# Patient Record
Sex: Female | Born: 1935 | Race: Black or African American | Hispanic: No | Marital: Single | Smoking: Never smoker
Health system: Southern US, Community
[De-identification: ages and names within clinical notes are randomized; demographics above are authoritative.]

## PROBLEM LIST (undated history)

## (undated) DIAGNOSIS — I10 Essential (primary) hypertension: Secondary | ICD-10-CM

## (undated) DIAGNOSIS — J45909 Unspecified asthma, uncomplicated: Secondary | ICD-10-CM

---

## 2020-10-10 ENCOUNTER — Other Ambulatory Visit: Payer: Self-pay

## 2020-10-10 ENCOUNTER — Encounter: Payer: Self-pay | Admitting: Emergency Medicine

## 2020-10-10 DIAGNOSIS — J45909 Unspecified asthma, uncomplicated: Secondary | ICD-10-CM | POA: Diagnosis not present

## 2020-10-10 DIAGNOSIS — Z79899 Other long term (current) drug therapy: Secondary | ICD-10-CM | POA: Insufficient documentation

## 2020-10-10 DIAGNOSIS — N3000 Acute cystitis without hematuria: Secondary | ICD-10-CM | POA: Diagnosis not present

## 2020-10-10 DIAGNOSIS — U071 COVID-19: Principal | ICD-10-CM | POA: Insufficient documentation

## 2020-10-10 DIAGNOSIS — I480 Paroxysmal atrial fibrillation: Secondary | ICD-10-CM | POA: Diagnosis not present

## 2020-10-10 DIAGNOSIS — I10 Essential (primary) hypertension: Secondary | ICD-10-CM | POA: Diagnosis not present

## 2020-10-10 DIAGNOSIS — K219 Gastro-esophageal reflux disease without esophagitis: Secondary | ICD-10-CM | POA: Insufficient documentation

## 2020-10-10 DIAGNOSIS — R531 Weakness: Secondary | ICD-10-CM | POA: Diagnosis present

## 2020-10-10 LAB — CBC
HCT: 40.7 % (ref 36.0–46.0)
Hemoglobin: 13.3 g/dL (ref 12.0–15.0)
MCH: 28.4 pg (ref 26.0–34.0)
MCHC: 32.7 g/dL (ref 30.0–36.0)
MCV: 86.8 fL (ref 80.0–100.0)
Platelets: 175 10*3/uL (ref 150–400)
RBC: 4.69 MIL/uL (ref 3.87–5.11)
RDW: 12.3 % (ref 11.5–15.5)
WBC: 3.5 10*3/uL — ABNORMAL LOW (ref 4.0–10.5)
nRBC: 0 % (ref 0.0–0.2)

## 2020-10-10 LAB — BASIC METABOLIC PANEL
Anion gap: 9 (ref 5–15)
BUN: 24 mg/dL — ABNORMAL HIGH (ref 8–23)
CO2: 24 mmol/L (ref 22–32)
Calcium: 9.1 mg/dL (ref 8.9–10.3)
Chloride: 103 mmol/L (ref 98–111)
Creatinine, Ser: 1.05 mg/dL — ABNORMAL HIGH (ref 0.44–1.00)
GFR, Estimated: 52 mL/min — ABNORMAL LOW (ref >60)
Glucose, Bld: 106 mg/dL — ABNORMAL HIGH (ref 70–99)
Potassium: 3.7 mmol/L (ref 3.5–5.1)
Sodium: 136 mmol/L (ref 135–145)

## 2020-10-10 LAB — TROPONIN I (HIGH SENSITIVITY)
Troponin I (High Sensitivity): 9 ng/L (ref <18)
Troponin I (High Sensitivity): 11 ng/L (ref <18)

## 2020-10-10 LAB — CBG MONITORING, ED: Glucose-Capillary: 96 mg/dL (ref 70–99)

## 2020-10-10 NOTE — ED Triage Notes (Signed)
Pt in via POV, reports new onset fatigue, weakness since Thursday.  Daughter reports she has done nothing but sleep since then, states this is far from her baseline.  Vitals WDL, NAD distress noted at this time.

## 2020-10-11 ENCOUNTER — Encounter: Payer: Self-pay | Admitting: Internal Medicine

## 2020-10-11 ENCOUNTER — Inpatient Hospital Stay: Payer: Medicare Other

## 2020-10-11 ENCOUNTER — Observation Stay
Admission: EM | Admit: 2020-10-11 | Discharge: 2020-10-13 | Disposition: A | Discharge status: Home / Self Care | Payer: Medicare Other | Attending: Internal Medicine | Admitting: Internal Medicine

## 2020-10-11 DIAGNOSIS — R531 Weakness: Secondary | ICD-10-CM

## 2020-10-11 DIAGNOSIS — R001 Bradycardia, unspecified: Secondary | ICD-10-CM | POA: Diagnosis present

## 2020-10-11 DIAGNOSIS — I10 Essential (primary) hypertension: Secondary | ICD-10-CM

## 2020-10-11 DIAGNOSIS — J45909 Unspecified asthma, uncomplicated: Secondary | ICD-10-CM

## 2020-10-11 DIAGNOSIS — U071 COVID-19: Secondary | ICD-10-CM | POA: Diagnosis not present

## 2020-10-11 DIAGNOSIS — N3000 Acute cystitis without hematuria: Secondary | ICD-10-CM

## 2020-10-11 DIAGNOSIS — I480 Paroxysmal atrial fibrillation: Secondary | ICD-10-CM

## 2020-10-11 DIAGNOSIS — K219 Gastro-esophageal reflux disease without esophagitis: Secondary | ICD-10-CM | POA: Diagnosis present

## 2020-10-11 DIAGNOSIS — R0602 Shortness of breath: Secondary | ICD-10-CM

## 2020-10-11 HISTORY — DX: Unspecified asthma, uncomplicated: J45.909

## 2020-10-11 HISTORY — DX: Essential (primary) hypertension: I10

## 2020-10-11 LAB — URINALYSIS, COMPLETE (UACMP) WITH MICROSCOPIC
Glucose, UA: NEGATIVE mg/dL
Hgb urine dipstick: NEGATIVE
Ketones, ur: 15 mg/dL — AB
Nitrite: NEGATIVE
Protein, ur: 100 mg/dL — AB
Specific Gravity, Urine: >1.03 — ABNORMAL HIGH (ref 1.005–1.030)
WBC, UA: >50 WBC/hpf (ref 0–5)
pH: 5.5 (ref 5.0–8.0)

## 2020-10-11 LAB — RESP PANEL BY RT-PCR (FLU A&B, COVID) ARPGX2
Influenza A by PCR: NEGATIVE
Influenza B by PCR: NEGATIVE
SARS Coronavirus 2 by RT PCR: POSITIVE — AB

## 2020-10-11 MED ORDER — ENOXAPARIN SODIUM 40 MG/0.4ML IJ SOSY
40.0000 mg | PREFILLED_SYRINGE | INTRAMUSCULAR | Status: DC
  Administered 2020-10-12 (×2): 40 mg via SUBCUTANEOUS
  Filled 2020-10-11 (×2): qty 0.4

## 2020-10-11 MED ORDER — ACETAMINOPHEN 325 MG PO TABS: 650.0000 mg | ORAL_TABLET | Freq: Four times a day (QID) | ORAL | Status: DC | PRN

## 2020-10-11 MED ORDER — ONDANSETRON HCL 4 MG/2ML IJ SOLN: 4.0000 mg | Freq: Four times a day (QID) | INTRAMUSCULAR | Status: DC | PRN

## 2020-10-11 MED ORDER — SODIUM CHLORIDE 0.9% FLUSH
3.0000 mL | Freq: Two times a day (BID) | INTRAVENOUS | Status: DC
  Administered 2020-10-12 – 2020-10-13 (×4): 3 mL via INTRAVENOUS

## 2020-10-11 MED ORDER — SODIUM CHLORIDE 0.9 % IV SOLN: 250.0000 mL | INTRAVENOUS | Status: DC | PRN

## 2020-10-11 MED ORDER — PANTOPRAZOLE SODIUM 40 MG PO TBEC
40.0000 mg | DELAYED_RELEASE_TABLET | Freq: Every day | ORAL | Status: DC
Start: 2020-10-11 — End: 2020-10-13
  Administered 2020-10-11 – 2020-10-13 (×3): 40 mg via ORAL
  Filled 2020-10-11 (×3): qty 1

## 2020-10-11 MED ORDER — REMDESIVIR 100 MG IV SOLR
100.0000 mg | Freq: Every day | INTRAVENOUS | Status: AC
Start: 2020-10-12 — End: 2020-10-13
  Administered 2020-10-12 – 2020-10-13 (×2): 100 mg via INTRAVENOUS
  Filled 2020-10-11: qty 20
  Filled 2020-10-11: qty 100

## 2020-10-11 MED ORDER — MOMETASONE FURO-FORMOTEROL FUM 200-5 MCG/ACT IN AERO
2.0000 | INHALATION_SPRAY | Freq: Two times a day (BID) | RESPIRATORY_TRACT | Status: DC
  Administered 2020-10-12 – 2020-10-13 (×2): 2 via RESPIRATORY_TRACT
  Filled 2020-10-11 (×2): qty 8.8

## 2020-10-11 MED ORDER — SODIUM CHLORIDE 0.9 % IV SOLN
200.0000 mg | Freq: Once | INTRAVENOUS | Status: AC
  Administered 2020-10-11: 200 mg via INTRAVENOUS
  Filled 2020-10-11: qty 200

## 2020-10-11 MED ORDER — GUAIFENESIN-DM 100-10 MG/5ML PO SYRP
10.0000 mL | ORAL_SOLUTION | ORAL | Status: DC | PRN
  Administered 2020-10-12: 10 mL via ORAL
  Filled 2020-10-11: qty 10

## 2020-10-11 MED ORDER — ONDANSETRON HCL 4 MG PO TABS: 4.0000 mg | ORAL_TABLET | Freq: Four times a day (QID) | ORAL | Status: DC | PRN

## 2020-10-11 MED ORDER — ADULT MULTIVITAMIN W/MINERALS CH
1.0000 | ORAL_TABLET | Freq: Every day | ORAL | Status: DC
  Administered 2020-10-11 – 2020-10-13 (×3): 1 via ORAL
  Filled 2020-10-11 (×3): qty 1

## 2020-10-11 MED ORDER — SODIUM CHLORIDE 0.9 % IV SOLN
1.0000 g | INTRAVENOUS | Status: DC
  Administered 2020-10-12 – 2020-10-13 (×2): 1 g via INTRAVENOUS
  Filled 2020-10-11: qty 1
  Filled 2020-10-11: qty 10

## 2020-10-11 MED ORDER — SODIUM CHLORIDE 0.9 % IV SOLN
1.0000 g | Freq: Once | INTRAVENOUS | Status: AC
  Administered 2020-10-11: 1 g via INTRAVENOUS
  Filled 2020-10-11: qty 10

## 2020-10-11 MED ORDER — ALBUTEROL SULFATE HFA 108 (90 BASE) MCG/ACT IN AERS
2.0000 | INHALATION_SPRAY | Freq: Four times a day (QID) | RESPIRATORY_TRACT | Status: DC | PRN
  Filled 2020-10-11: qty 6.7

## 2020-10-11 MED ORDER — SODIUM CHLORIDE 0.9% FLUSH: 3.0000 mL | INTRAVENOUS | Status: DC | PRN

## 2020-10-11 MED ORDER — MOMETASONE FURO-FORMOTEROL FUM 200-5 MCG/ACT IN AERO: 2.0000 | INHALATION_SPRAY | Freq: Two times a day (BID) | RESPIRATORY_TRACT | Status: DC

## 2020-10-11 MED ORDER — AMLODIPINE BESYLATE 10 MG PO TABS
10.0000 mg | ORAL_TABLET | Freq: Every day | ORAL | Status: DC
  Administered 2020-10-11 – 2020-10-13 (×3): 10 mg via ORAL
  Filled 2020-10-11: qty 1
  Filled 2020-10-11 (×2): qty 2

## 2020-10-11 MED ORDER — ASCORBIC ACID 500 MG PO TABS
500.0000 mg | ORAL_TABLET | Freq: Every day | ORAL | Status: DC
  Administered 2020-10-11 – 2020-10-13 (×3): 500 mg via ORAL
  Filled 2020-10-11 (×3): qty 1

## 2020-10-11 MED ORDER — ACETAMINOPHEN ER 650 MG PO TBCR: 650.0000 mg | EXTENDED_RELEASE_TABLET | Freq: Three times a day (TID) | ORAL | Status: DC | PRN

## 2020-10-11 MED ORDER — ZINC SULFATE 220 (50 ZN) MG PO CAPS
220.0000 mg | ORAL_CAPSULE | Freq: Every day | ORAL | Status: DC
  Administered 2020-10-11 – 2020-10-13 (×3): 220 mg via ORAL
  Filled 2020-10-11 (×3): qty 1

## 2020-10-11 NOTE — H&P (Signed)
History and Physical    Carol Cabrera KZS:[id number] DOB: Dec 12, 1934 DOA: 10/11/2020  PCP: Pcp, No   Patient coming from: Home  I have personally briefly reviewed patient's old medical records in [facility]  Chief Complaint: Generalized weakness  HPI: Carol Cabrera is a 86 y.o. female with medical history significant for hypertension and asthma who presents to the ER for evaluation of generalized weakness. Patient states that her symptoms started about 5 days ago while she was at work where she just did not feel well and felt very weak and tired.  She states that she left work and went home and has been in her bed since then. She complains of generalized weakness, malaise, body aches, cough productive of clear phlegm and chills.  Her appetite has been poor and she denies having any nausea, no vomiting, no diarrhea.  Patient has been very weak and has had difficulty with ambulating even short distances because she feels very weak and like she is going to pass out which concerned her daughter who brought her to the emergency room for further evaluation. Patient has received 2 doses of the COVID-19 vaccine but not the booster and denies having any sick contacts. She denies having any chest pain, no shortness of breath, no urinary frequency, no nocturia, no dysuria, no changes in her bowel habits, no leg swelling, no blurred vision, no focal deficits, no palpitations or diaphoresis. Labs show sodium 136, potassium 3.7, chloride 103, bicarb 24, glucose 106, BUN 4, creatinine 1.05, calcium 9.1, troponin 9 >> 11, white count 3.5, hemoglobin 13.3, hematocrit 40.7, MCV 86.8, RDW 12.3, platelet count 175 Patient's SARS coronavirus 2 point-of-care test is positive Twelve-lead EKG reviewed by me shows sinus rhythm with PACs.   ED Course: Patient is an 86 year old African-American female with a past medical history significant for hypertension, asthma and GERD who presents to the ER for evaluation of a  5-day history of generalized weakness, poor oral intake, myalgias and chills.  Her point-of-care coronavirus 2 test is positive. Patient received 2 doses of the COVID-19 vaccine but not the booster shot.  She will be admitted to the hospital for further evaluation.  Review of Systems: As per HPI otherwise all other systems reviewed and negative.    Past Medical History:  Diagnosis Date   Asthma    Hypertension     History reviewed. No pertinent surgical history.   reports that she has never smoked. She has never used smokeless tobacco. She reports that she does not currently use alcohol. She reports that she does not use drugs.  Allergies  Allergen Reactions   Aspirin Shortness Of Breath    History reviewed. No pertinent family history.  None   Prior to Admission medications   Medication Sig Start Date End Date Taking? Authorizing Provider  acetaminophen (TYLENOL) 650 MG CR tablet Take 650 mg by mouth every 8 (eight) hours as needed for pain.   Yes [provider]  diphenhydrAMINE (BENADRYL) 25 mg capsule Take 50 mg by mouth daily at 12 noon.   Yes [provider]  Multiple Vitamins-Minerals (MULTIVITAMIN WITH MINERALS) tablet Take 1 tablet by mouth daily.   Yes [provider]  amLODipine (NORVASC) 10 MG tablet Take 10 mg by mouth daily. 09/22/20   [provider]  esomeprazole (NEXIUM) 40 MG capsule Take 40 mg by mouth daily. 09/22/20   [provider]  losartan (COZAAR) 50 MG tablet Take 50 mg by mouth daily. 09/29/20   [provider]  WIXELA INHUB 250-50 MCG/ACT AEPB Inhale 1 puff into the lungs daily. 09/29/20   [provider]    Physical Exam: Vitals:   10/10/20 1950 10/10/20 2258 10/11/20 0249 10/11/20 0528  BP:  103/88 123/74 (!) 137/92  Pulse:  82 82 72  Resp:  15 18 20   Temp:  98.7 F (37.1 C)  98.7 F (37.1 C)  TempSrc:  Oral  Oral  SpO2:  94% 95% 98%  Weight: 58.5 kg     Height: 5\' 9"  (1.753 m)         Vitals:   10/10/20 1950 10/10/20 2258 10/11/20 0249 10/11/20 0528  BP:  103/88 123/74 (!) 137/92  Pulse:  82 82 72  Resp:  15 18 20   Temp:  98.7 F (37.1 C)  98.7 F (37.1 C)  TempSrc:  Oral  Oral  SpO2:  94% 95% 98%  Weight: 58.5 kg     Height: 5\' 9"  (1.753 m)         Constitutional: Alert and oriented x 3 . Not in any apparent distress.  Looks younger than stated age HEENT:      Head: Normocephalic and atraumatic.         Eyes: PERLA, EOMI, Conjunctivae are normal. Sclera is non-icteric.       Mouth/Throat: Mucous membranes are moist.       Neck: Supple with no signs of meningismus. Cardiovascular: Regular rate and rhythm. No murmurs, gallops, or rubs. 2+ symmetrical distal pulses are present . No JVD. No LE edema Respiratory: Respiratory effort normal .Lungs sounds clear bilaterally. No wheezes, crackles, or rhonchi.  Gastrointestinal: Soft, non tender, and non distended with positive bowel sounds.  Genitourinary: No CVA tenderness. Musculoskeletal: Nontender with normal range of motion in all extremities. No cyanosis, or erythema of extremities. Neurologic:  Face is symmetric. Moving all extremities. No gross focal neurologic deficits . Skin: Skin is warm, dry.  No rash or ulcers Psychiatric: Mood and affect are normal    Labs on Admission: I have personally reviewed following labs and imaging studies  CBC: Recent Labs  Lab 10/10/20 1957  WBC 3.5*  HGB 13.3  HCT 40.7  MCV 86.8  PLT 175   Basic Metabolic Panel: Recent Labs  Lab 10/10/20 1957  NA 136  K 3.7  CL 103  CO2 24  GLUCOSE 106*  BUN 24*  CREATININE 1.05*  CALCIUM 9.1   GFR: Estimated Creatinine Clearance: 36.8 mL/min (A) (by C-G formula based on SCr of 1.05 mg/dL (H)). Liver Function Tests: No results for input(s): AST, ALT, ALKPHOS, BILITOT, PROT, ALBUMIN in the last 168 hours. No results for input(s): LIPASE, AMYLASE in the last 168 hours. No results for input(s): AMMONIA in the last  168 hours. Coagulation Profile: No results for input(s): INR, PROTIME in the last 168 hours. Cardiac Enzymes: No results for input(s): CKTOTAL, CKMB, CKMBINDEX, TROPONINI in the last 168 hours. BNP (last 3 results) No results for input(s): PROBNP in the last 8760 hours. HbA1C: No results for input(s): HGBA1C in the last 72 hours. CBG: Recent Labs  Lab 10/10/20 1958  GLUCAP 96   Lipid Profile: No results for input(s): CHOL, HDL, LDLCALC, TRIG, CHOLHDL, LDLDIRECT in the last 72 hours. Thyroid Function Tests: No results for input(s): TSH, T4TOTAL, FREET4, T3FREE, THYROIDAB in the last 72 hours. Anemia Panel: No results for input(s): VITAMINB12, FOLATE, FERRITIN, TIBC, IRON, RETICCTPCT in the last 72 hours. Urine analysis:    Component Value Date/Time  COLORURINE AMBER (A) 10/11/2020 0550   APPEARANCEUR CLOUDY (A) 10/11/2020 0550   LABSPEC >1.030 (H) 10/11/2020 0550   PHURINE 5.5 10/11/2020 0550   GLUCOSEU NEGATIVE 10/11/2020 0550   HGBUR NEGATIVE 10/11/2020 0550   BILIRUBINUR MODERATE (A) 10/11/2020 0550   KETONESUR 15 (A) 10/11/2020 0550   PROTEINUR 100 (A) 10/11/2020 0550   NITRITE NEGATIVE 10/11/2020 0550   LEUKOCYTESUR SMALL (A) 10/11/2020 0550    Radiological Exams on Admission: No results found.   Assessment/Plan Principal Problem:   COVID-19 virus detected Active Problems:   Asthma   Hypertension   GERD (gastroesophageal reflux disease)   Generalized weakness     Patient is an 86 year old African-American female with a past medical history significant for asthma and hypertension who presents to the emergency room for evaluation of generalized weakness and her COVID-19 point-of-care test is positive.    COVID-19 viral infection Will place patient on remdesivir per protocol Supportive care with antitussives, vitamin and as needed bronchodilator therapy    Hypertension Continue amlodipine   GERD Continue omeprazole   History of  asthma Stable Continue as needed bronchodilator therapy and inhaled steroids    Generalized weakness We will request PT evaluation  DVT prophylaxis: Lovenox  Code Status: full code  Family Communication: Greater than 50% of time was spent discussing patient's condition and plan of care with her and her daughter at the bedside.  All questions and concerns have been addressed.  They verbalized understanding and agree with the plan. Disposition Plan: Back to previous home environment Consults called: Physical therapy Status:At the time of admission, it appears that the appropriate admission status for this patient is inpatient. This is judged to be reasonable and necessary to provide the required intensity of service to ensure the patient's safety given the presenting symptoms, physical exam findings, and initial radiographic and laboratory data in the context of their comorbid conditions. Patient requires inpatient status due to high intensity of service, high risk for further deterioration and high frequency of surveillance required.     Lucile Shutters MD [facility]     10/11/2020, 11:15 AM

## 2020-10-11 NOTE — ED Provider Notes (Signed)
[facility] Emergency Department Provider Note   ____________________________________________   Event Date/Time   First MD Initiated Contact with Patient 10/11/20 [phone]     (approximate)  I have reviewed the triage vital signs and the nursing notes.   HISTORY  Chief Complaint Generalized Weakness    HPI Carol Cabrera is a 86 y.o. female with past medical history of hypertension and asthma who presents to the ED complaining of generalized weakness.  Patient reports that she started feeling ill 5 days ago with generalized weakness, malaise, body aches, and cough.  She denies any fevers but does report chills.  She has had a decreased appetite and has not had much to eat for the past couple of days, daughter states she has spent up to 20 hours of the day in bed.  She has had difficulty walking, is normally high functioning and continues to work.  She does endorse some mild discomfort when she urinates, denies any abdominal pain, flank pain, vomiting, or diarrhea.  She has received 2 doses of the COVID-19 vaccine, denies any sick contacts.        Past Medical History:  Diagnosis Date   Asthma    Hypertension     There are no problems to display for this patient.   History reviewed. No pertinent surgical history.  Prior to Admission medications   Not on File    Allergies Aspirin  No family history on file.  Social History Social History   Tobacco Use   Smoking status: Never   Smokeless tobacco: Never  Vaping Use   Vaping Use: Never used  Substance Use Topics   Alcohol use: Not Currently   Drug use: Never    Review of Systems  Constitutional: Positive for chills, negative for fevers.  Positive for generalized weakness and malaise. Eyes: No visual changes. ENT: No sore throat. Cardiovascular: Denies chest pain. Respiratory: Denies shortness of breath. Gastrointestinal: No abdominal pain.  No nausea, no vomiting.  No diarrhea.  No  constipation. Genitourinary: Positive for dysuria. Musculoskeletal: Negative for back pain. Skin: Negative for rash. Neurological: Negative for headaches, focal weakness or numbness.  ____________________________________________   PHYSICAL EXAM:  VITAL SIGNS: ED Triage Vitals  Enc Vitals Group     BP 10/10/20 1948 122/85     Pulse Rate 10/10/20 1948 88     Resp 10/10/20 1948 19     Temp 10/10/20 1948 99.6 F (37.6 C)     Temp Source 10/10/20 1948 Oral     SpO2 10/10/20 1948 95 %     Weight 10/10/20 1950 129 lb (58.5 kg)     Height 10/10/20 1950 5\' 9"  (1.753 m)     Head Circumference --      Peak Flow --      Pain Score 10/10/20 1950 0     Pain Loc --      Pain Edu? --      Excl. in GC? --     Constitutional: Alert and oriented. Eyes: Conjunctivae are normal. Head: Atraumatic. Nose: No congestion/rhinnorhea. Mouth/Throat: Mucous membranes are moist. Neck: Normal ROM Cardiovascular: Normal rate, regular rhythm. Grossly normal heart sounds.  2+ radial pulses bilaterally. Respiratory: Normal respiratory effort.  No retractions. Lungs CTAB. Gastrointestinal: Soft and nontender. No distention. Genitourinary: deferred Musculoskeletal: No lower extremity tenderness nor edema. Neurologic:  Normal speech and language. No gross focal neurologic deficits are appreciated. Skin:  Skin is warm, dry and intact. No rash noted. Psychiatric: Mood and affect  are normal. Speech and behavior are normal.  ____________________________________________   LABS (all labs ordered are listed, but only abnormal results are displayed)  Labs Reviewed  RESP PANEL BY RT-PCR (FLU A&B, COVID) ARPGX2 - Abnormal; Notable for the following components:      Result Value   SARS Coronavirus 2 by RT PCR POSITIVE (*)    All other components within normal limits  BASIC METABOLIC PANEL - Abnormal; Notable for the following components:   Glucose, Bld 106 (*)    BUN 24 (*)    Creatinine, Ser 1.05 (*)     GFR, Estimated 52 (*)    All other components within normal limits  CBC - Abnormal; Notable for the following components:   WBC 3.5 (*)    All other components within normal limits  URINALYSIS, COMPLETE (UACMP) WITH MICROSCOPIC - Abnormal; Notable for the following components:   Color, Urine AMBER (*)    APPearance CLOUDY (*)    Specific Gravity, Urine >1.030 (*)    Bilirubin Urine MODERATE (*)    Ketones, ur 15 (*)    Protein, ur 100 (*)    Leukocytes,Ua SMALL (*)    Bacteria, UA FEW (*)    All other components within normal limits  URINE CULTURE  CBG MONITORING, ED  TROPONIN I (HIGH SENSITIVITY)  TROPONIN I (HIGH SENSITIVITY)   ____________________________________________  EKG  ED ECG REPORT I, Chesley Noon, the attending physician, personally viewed and interpreted this ECG.   Date: 10/11/2020  EKG Time: 19:49  Rate: 93  Rhythm: normal sinus rhythm, PAC's noted  Axis: Normal  Intervals:none  ST&T Change: None    PROCEDURES  Procedure(s) performed (including Critical Care):  Procedures   ____________________________________________   INITIAL IMPRESSION / ASSESSMENT AND PLAN / ED COURSE      86 year old female with past medical history of hypertension and asthma presents to the ED with 5 days of worsening generalized weakness, malaise, chills, cough, and dysuria.  Patient is overall well-appearing with reassuring vital signs, but does appear profoundly weak and is unable to walk on her own.  EKG shows no evidence of arrhythmia or ischemia, does show frequent PACs.  Chest x-ray reviewed by me and shows no infiltrate, edema, or effusion.  Labs are unremarkable but testing for COVID-19 is positive and UA is concerning for infection.  UTI and COVID infection could both be contributing to her weakness.  We will give dose of IV Rocephin and hydrate with IV fluids given her poor oral intake.  Given her profound weakness, plan to discuss with hospitalist for  admission.      ____________________________________________   FINAL CLINICAL IMPRESSION(S) / ED DIAGNOSES  Final diagnoses:  COVID-19  Acute cystitis without hematuria  Generalized weakness     ED Discharge Orders     None        Note:  This document was prepared using Dragon voice recognition software and may include unintentional dictation errors.    Chesley Noon, MD 10/11/20 [phone]

## 2020-10-11 NOTE — ED Notes (Signed)
Pt moved to hospital bed. Pt was able to independently stand up and move to chair next to bed when swapping out beds.

## 2020-10-12 DIAGNOSIS — R001 Bradycardia, unspecified: Secondary | ICD-10-CM | POA: Diagnosis present

## 2020-10-12 DIAGNOSIS — I10 Essential (primary) hypertension: Secondary | ICD-10-CM | POA: Diagnosis not present

## 2020-10-12 DIAGNOSIS — U071 COVID-19: Secondary | ICD-10-CM | POA: Diagnosis not present

## 2020-10-12 LAB — C-REACTIVE PROTEIN: CRP: 1.1 mg/dL — ABNORMAL HIGH (ref <1.0)

## 2020-10-12 LAB — COMPREHENSIVE METABOLIC PANEL
ALT: 16 U/L (ref 0–44)
AST: 24 U/L (ref 15–41)
Albumin: 3.2 g/dL — ABNORMAL LOW (ref 3.5–5.0)
Alkaline Phosphatase: 51 U/L (ref 38–126)
Anion gap: 10 (ref 5–15)
BUN: 26 mg/dL — ABNORMAL HIGH (ref 8–23)
CO2: 23 mmol/L (ref 22–32)
Calcium: 9.1 mg/dL (ref 8.9–10.3)
Chloride: 103 mmol/L (ref 98–111)
Creatinine, Ser: 0.82 mg/dL (ref 0.44–1.00)
GFR, Estimated: >60 mL/min (ref >60)
Glucose, Bld: 83 mg/dL (ref 70–99)
Potassium: 3.7 mmol/L (ref 3.5–5.1)
Sodium: 136 mmol/L (ref 135–145)
Total Bilirubin: 0.9 mg/dL (ref 0.3–1.2)
Total Protein: 7.4 g/dL (ref 6.5–8.1)

## 2020-10-12 LAB — CBC WITH DIFFERENTIAL/PLATELET
Abs Immature Granulocytes: 0.01 10*3/uL (ref 0.00–0.07)
Basophils Absolute: 0 10*3/uL (ref 0.0–0.1)
Basophils Relative: 0 %
Eosinophils Absolute: 0 10*3/uL (ref 0.0–0.5)
Eosinophils Relative: 1 %
HCT: 38.7 % (ref 36.0–46.0)
Hemoglobin: 12.8 g/dL (ref 12.0–15.0)
Immature Granulocytes: 0 %
Lymphocytes Relative: 55 %
Lymphs Abs: 1.7 10*3/uL (ref 0.7–4.0)
MCH: 28.6 pg (ref 26.0–34.0)
MCHC: 33.1 g/dL (ref 30.0–36.0)
MCV: 86.4 fL (ref 80.0–100.0)
Monocytes Absolute: 0.3 10*3/uL (ref 0.1–1.0)
Monocytes Relative: 11 %
Neutro Abs: 1 10*3/uL — ABNORMAL LOW (ref 1.7–7.7)
Neutrophils Relative %: 33 %
Platelets: 154 10*3/uL (ref 150–400)
RBC: 4.48 MIL/uL (ref 3.87–5.11)
RDW: 12.2 % (ref 11.5–15.5)
WBC: 3 10*3/uL — ABNORMAL LOW (ref 4.0–10.5)
nRBC: 0 % (ref 0.0–0.2)

## 2020-10-12 LAB — URINE CULTURE

## 2020-10-12 LAB — MAGNESIUM: Magnesium: 1.8 mg/dL (ref 1.7–2.4)

## 2020-10-12 LAB — D-DIMER, QUANTITATIVE: D-Dimer, Quant: 1.78 ug/mL-FEU — ABNORMAL HIGH (ref 0.00–0.50)

## 2020-10-12 LAB — PROCALCITONIN: Procalcitonin: <0.1 ng/mL

## 2020-10-12 NOTE — Progress Notes (Signed)
Patient HR dropped into 30's with a 4 second pause, this RN ran in the room and tapped on patient to wake her up. She woke up and stated she felt very nauseous. At this point HR came up into 50's with irregular rhythm. Attending MD notified and an EKG was obtained. EKG showing Afib with HR now in 90's. BP 162/94 currently.  MD made aware. Family is at bedside.

## 2020-10-12 NOTE — ED Notes (Signed)
Verbal report not received.

## 2020-10-12 NOTE — Care Management CC44 (Signed)
Condition Code 44 Documentation Completed  Patient Details  Name: Carol Cabrera MRN: [id number] Date of Birth: 1934/05/21   Condition Code 44 given:  Yes Patient signature on Condition Code 44 notice:  Yes Documentation of 2 MD's agreement:  Yes Code 44 added to claim:  Yes    Joseph Art, LCSWA 10/12/2020, 4:24 PM

## 2020-10-12 NOTE — Progress Notes (Signed)
PROGRESS NOTE    Carol Cabrera  JJK:[id number] DOB: 10/30/1934 DOA: 10/11/2020 PCP: Pcp, No   Chief complaint.  Generalized weakness. Brief Narrative:   Carol Cabrera is a 86 y.o. female with medical history significant for hypertension and asthma who presents to the ER for evaluation of generalized weakness. She was found to have positive COVID.  She was started on remdesivir. While on the monitor, patient had a 3 to 4-second pause with a base rhythm of atrial fibrillation.  But patient was asymptomatic.  Assessment & Plan:   Principal Problem:   COVID-19 virus detected Active Problems:   Asthma   Hypertension   GERD (gastroesophageal reflux disease)   Generalized weakness  #1.  COVID-19 pneumonia. Leukopenia secondary to COVID infection. Reviewed chest x-ray, right lower lobe infiltrates.  However, patient does not have any hypoxia, continue remdesivir.  No need for steroids. Also check procalcitonin level to rule out bacterial pneumonia. Continue Rocephin for now.  2.  Atrial fibrillation with bradycardia. Patient has no known history of atrial fibrillation, she had a 3 to 4-second pause with fibrillation. Will obtain cardiology consult.  #3.  Essential hypertension.     DVT prophylaxis: Lovenox Code Status: full Family Communication:  Disposition Plan:    Status is: Inpatient  Remains inpatient appropriate because:IV treatments appropriate due to intensity of illness or inability to take PO and Inpatient level of care appropriate due to severity of illness  Dispo: The patient is from: Home              Anticipated d/c is to: Home              Patient currently is not medically stable to d/c.   Difficult to place patient No        I/O last 3 completed shifts: In: -  Out: 900 [Urine:900] No intake/output data recorded.     Consultants:  cardiology  Procedures: None  Antimicrobials: Rocephin Subjective: Patient doing well today.  I was called by the  nurse, patient had a 3 to 4 seconds of pause with basal rate of atrial fibrillation. But the patient was asymptomatic and was sleeping. Patient denies any short of breath, she has a cough with thin mucus. No fever or chills. No dysuria hematuria.  No abdominal pain or nausea vomiting.  Objective: Vitals:   10/12/20 0300 10/12/20 0600 10/12/20 0700 10/12/20 0900  BP: (!) 154/84 (!) 158/83 (!) 146/85 (!) 143/65  Pulse: 77 73 77 70  Resp:  (!) 23 (!) 22 18  Temp: 99.1 F (37.3 C)     TempSrc: Oral     SpO2: 95% 95% 95% 95%  Weight:      Height:        Intake/Output Summary (Last 24 hours) at 10/12/2020 1331 Last data filed at 10/12/2020 0600 Gross per 24 hour  Intake --  Output 900 ml  Net -900 ml   Filed Weights   10/10/20 1950  Weight: 58.5 kg    Examination:  General exam: Appears calm and comfortable  Respiratory system: Clear to auscultation. Respiratory effort normal. Cardiovascular system: Irregular. No JVD, murmurs, rubs, gallops or clicks. No pedal edema. Gastrointestinal system: Abdomen is nondistended, soft and nontender. No organomegaly or masses felt. Normal bowel sounds heard. Central nervous system: Alert and oriented x2. No focal neurological deficits. Extremities: Symmetric 5 x 5 power. Skin: No rashes, lesions or ulcers Psychiatry: Judgement and insight appear normal. Mood & affect appropriate.     Data Reviewed:  I have personally reviewed following labs and imaging studies  CBC: Recent Labs  Lab 10/10/20 1957 10/12/20 0444  WBC 3.5* 3.0*  NEUTROABS  --  1.0*  HGB 13.3 12.8  HCT 40.7 38.7  MCV 86.8 86.4  PLT 175 154   Basic Metabolic Panel: Recent Labs  Lab 10/10/20 1957 10/12/20 0444  NA 136 136  K 3.7 3.7  CL 103 103  CO2 24 23  GLUCOSE 106* 83  BUN 24* 26*  CREATININE 1.05* 0.82  CALCIUM 9.1 9.1  MG  --  1.8   GFR: Estimated Creatinine Clearance: 47.2 mL/min (by C-G formula based on SCr of 0.82 mg/dL). Liver Function  Tests: Recent Labs  Lab 10/12/20 0444  AST 24  ALT 16  ALKPHOS 51  BILITOT 0.9  PROT 7.4  ALBUMIN 3.2*   No results for input(s): LIPASE, AMYLASE in the last 168 hours. No results for input(s): AMMONIA in the last 168 hours. Coagulation Profile: No results for input(s): INR, PROTIME in the last 168 hours. Cardiac Enzymes: No results for input(s): CKTOTAL, CKMB, CKMBINDEX, TROPONINI in the last 168 hours. BNP (last 3 results) No results for input(s): PROBNP in the last 8760 hours. HbA1C: No results for input(s): HGBA1C in the last 72 hours. CBG: Recent Labs  Lab 10/10/20 1958  GLUCAP 96   Lipid Profile: No results for input(s): CHOL, HDL, LDLCALC, TRIG, CHOLHDL, LDLDIRECT in the last 72 hours. Thyroid Function Tests: No results for input(s): TSH, T4TOTAL, FREET4, T3FREE, THYROIDAB in the last 72 hours. Anemia Panel: No results for input(s): VITAMINB12, FOLATE, FERRITIN, TIBC, IRON, RETICCTPCT in the last 72 hours. Sepsis Labs: No results for input(s): PROCALCITON, LATICACIDVEN in the last 168 hours.  Recent Results (from the past 240 hour(s))  Resp Panel by RT-PCR (Flu A&B, Covid) Urine, Clean Catch     Status: Abnormal   Collection Time: 10/11/20  5:30 AM   Specimen: Urine, Clean Catch; Nasopharyngeal(NP) swabs in vial transport medium  Result Value Ref Range Status   SARS Coronavirus 2 by RT PCR POSITIVE (A) NEGATIVE Final    Comment: RESULT CALLED TO, READ BACK BY AND VERIFIED WITH: Tonia Ghent, RN [phone] 10/11/20 GM (NOTE) SARS-CoV-2 target nucleic acids are DETECTED.  The SARS-CoV-2 RNA is generally detectable in upper respiratory specimens during the acute phase of infection. Positive results are indicative of the presence of the identified virus, but do not rule out bacterial infection or co-infection with other pathogens not detected by the test. Clinical correlation with patient history and other diagnostic information is necessary to determine  patient infection status. The expected result is Negative.  Fact Sheet for Patients: BloggerCourse.com  Fact Sheet for Healthcare Providers: SeriousBroker.it  This test is not yet approved or cleared by the [place] FDA and  has been authorized for detection and/or diagnosis of SARS-CoV-2 by FDA under an Emergency Use Authorization (EUA).  This EUA will remain in effect (meaning this test can be use d) for the duration of  the COVID-19 declaration under Section 564(b)(1) of the Act, 21 U.S.C. section 360bbb-3(b)(1), unless the authorization is terminated or revoked sooner.     Influenza A by PCR NEGATIVE NEGATIVE Final   Influenza B by PCR NEGATIVE NEGATIVE Final    Comment: (NOTE) The Xpert Xpress SARS-CoV-2/FLU/RSV plus assay is intended as an aid in the diagnosis of influenza from Nasopharyngeal swab specimens and should not be used as a sole basis for treatment. Nasal washings and aspirates are unacceptable for Xpert Xpress  SARS-CoV-2/FLU/RSV testing.  Fact Sheet for Patients: BloggerCourse.com  Fact Sheet for Healthcare Providers: SeriousBroker.it  This test is not yet approved or cleared by the [place] FDA and has been authorized for detection and/or diagnosis of SARS-CoV-2 by FDA under an Emergency Use Authorization (EUA). This EUA will remain in effect (meaning this test can be used) for the duration of the COVID-19 declaration under Section 564(b)(1) of the Act, 21 U.S.C. section 360bbb-3(b)(1), unless the authorization is terminated or revoked.  Performed at [facility], [address], [place], [place] [address]   Urine Culture     Status: Abnormal   Collection Time: 10/11/20  5:56 AM   Specimen: Urine, Random  Result Value Ref Range Status   Specimen Description   Final    URINE, RANDOM Performed at [facility], [address], [place], [place] [address]    Special Requests   Final    NONE Performed at [facility], [address] [place], [place] [address]    Culture MULTIPLE SPECIES PRESENT, SUGGEST RECOLLECTION (A)  Final   Report Status 10/12/2020 FINAL  Final         Radiology Studies: [facility] Chest Port 1 View  Result Date: 10/11/2020 CLINICAL DATA:  Shortness of breath and weakness. EXAM: PORTABLE CHEST 1 VIEW COMPARISON:  Chest x-ray report dated September 30, 2001. FINDINGS: The heart size and mediastinal contours are within normal limits. Normal pulmonary vascularity. Mild patchy density in the medial right lung base. No pleural effusion or pneumothorax. No acute osseous abnormality. Unchanged chronic deformity of the left ninth rib. IMPRESSION: 1. Right basilar atelectasis versus pneumonia. Electronically Signed   By: Obie Dredge M.D.   On: 10/11/2020 12:21        Scheduled Meds:  amLODipine  10 mg Oral Daily   vitamin C  500 mg Oral Daily   enoxaparin (LOVENOX) injection  40 mg Subcutaneous Q24H   mometasone-formoterol  2 puff Inhalation BID   multivitamin with minerals  1 tablet Oral Daily   pantoprazole  40 mg Oral Daily   sodium chloride flush  3 mL Intravenous Q12H   zinc sulfate  220 mg Oral Daily   Continuous Infusions:  sodium chloride     cefTRIAXone (ROCEPHIN)  IV Stopped (10/12/20 1037)   remdesivir 100 mg in NS 100 mL Stopped (10/12/20 1148)     LOS: 1 day    Time spent: 32 minutes    Marrion Coy, MD [facility]   To contact the attending provider between 7A-7P or the covering provider during after hours 7P-7A, please log into the web site [email or web address] and access using universal [place] password for that web site. If you do not have the password, please call the hospital operator.  10/12/2020, 1:31 PM

## 2020-10-12 NOTE — Care Management Obs Status (Signed)
MEDICARE OBSERVATION STATUS NOTIFICATION   Patient Details  Name: Carol Cabrera MRN: [id number] Date of Birth: 18-Dec-1934   Medicare Observation Status Notification Given:       Carol Cabrera 10/12/2020, 4:24 PM

## 2020-10-12 NOTE — Progress Notes (Signed)
Patient had another episode of HR dropping into 30's, this RN to check on patient. HR came up to 120's-130's AFib, is now currently, 90's-100. MD made aware.

## 2020-10-13 ENCOUNTER — Telehealth: Payer: Self-pay | Admitting: Cardiovascular Disease

## 2020-10-13 ENCOUNTER — Observation Stay (HOSPITAL_BASED_OUTPATIENT_CLINIC_OR_DEPARTMENT_OTHER)
Admit: 2020-10-13 | Discharge: 2020-10-13 | Disposition: A | Discharge status: Home / Self Care | Payer: Medicare Other | Attending: Cardiology | Admitting: Cardiology

## 2020-10-13 ENCOUNTER — Observation Stay: Payer: Medicare Other

## 2020-10-13 ENCOUNTER — Observation Stay (INDEPENDENT_AMBULATORY_CARE_PROVIDER_SITE_OTHER): Payer: Medicare Other | Admitting: Medical

## 2020-10-13 DIAGNOSIS — U071 COVID-19: Secondary | ICD-10-CM | POA: Diagnosis not present

## 2020-10-13 DIAGNOSIS — I4891 Unspecified atrial fibrillation: Secondary | ICD-10-CM | POA: Diagnosis not present

## 2020-10-13 DIAGNOSIS — R531 Weakness: Secondary | ICD-10-CM | POA: Diagnosis not present

## 2020-10-13 DIAGNOSIS — R001 Bradycardia, unspecified: Secondary | ICD-10-CM | POA: Diagnosis not present

## 2020-10-13 DIAGNOSIS — I455 Other specified heart block: Secondary | ICD-10-CM

## 2020-10-13 DIAGNOSIS — I480 Paroxysmal atrial fibrillation: Secondary | ICD-10-CM | POA: Diagnosis not present

## 2020-10-13 LAB — CBC WITH DIFFERENTIAL/PLATELET
Abs Immature Granulocytes: 0.03 10*3/uL (ref 0.00–0.07)
Basophils Absolute: 0 10*3/uL (ref 0.0–0.1)
Basophils Relative: 0 %
Eosinophils Absolute: 0 10*3/uL (ref 0.0–0.5)
Eosinophils Relative: 0 %
HCT: 37.8 % (ref 36.0–46.0)
Hemoglobin: 12.7 g/dL (ref 12.0–15.0)
Immature Granulocytes: 1 %
Lymphocytes Relative: 49 %
Lymphs Abs: 1.8 10*3/uL (ref 0.7–4.0)
MCH: 29.4 pg (ref 26.0–34.0)
MCHC: 33.6 g/dL (ref 30.0–36.0)
MCV: 87.5 fL (ref 80.0–100.0)
Monocytes Absolute: 0.4 10*3/uL (ref 0.1–1.0)
Monocytes Relative: 11 %
Neutro Abs: 1.5 10*3/uL — ABNORMAL LOW (ref 1.7–7.7)
Neutrophils Relative %: 39 %
Platelets: 172 10*3/uL (ref 150–400)
RBC: 4.32 MIL/uL (ref 3.87–5.11)
RDW: 12.1 % (ref 11.5–15.5)
WBC: 3.7 10*3/uL — ABNORMAL LOW (ref 4.0–10.5)
nRBC: 0 % (ref 0.0–0.2)

## 2020-10-13 LAB — COMPREHENSIVE METABOLIC PANEL
ALT: 14 U/L (ref 0–44)
AST: 23 U/L (ref 15–41)
Albumin: 3.3 g/dL — ABNORMAL LOW (ref 3.5–5.0)
Alkaline Phosphatase: 50 U/L (ref 38–126)
Anion gap: 10 (ref 5–15)
BUN: 30 mg/dL — ABNORMAL HIGH (ref 8–23)
CO2: 22 mmol/L (ref 22–32)
Calcium: 9.2 mg/dL (ref 8.9–10.3)
Chloride: 102 mmol/L (ref 98–111)
Creatinine, Ser: 0.85 mg/dL (ref 0.44–1.00)
GFR, Estimated: >60 mL/min (ref >60)
Glucose, Bld: 104 mg/dL — ABNORMAL HIGH (ref 70–99)
Potassium: 3.4 mmol/L — ABNORMAL LOW (ref 3.5–5.1)
Sodium: 134 mmol/L — ABNORMAL LOW (ref 135–145)
Total Bilirubin: 0.8 mg/dL (ref 0.3–1.2)
Total Protein: 7.2 g/dL (ref 6.5–8.1)

## 2020-10-13 LAB — ECHOCARDIOGRAM COMPLETE
AR max vel: 2.07 cm2
AV Area VTI: 1.92 cm2
AV Area mean vel: 1.8 cm2
AV Mean grad: 3 mmHg
AV Peak grad: 5.1 mmHg
Ao pk vel: 1.13 m/s
Area-P 1/2: 2.85 cm2
S' Lateral: 2.51 cm

## 2020-10-13 LAB — D-DIMER, QUANTITATIVE: D-Dimer, Quant: 1.23 ug/mL-FEU — ABNORMAL HIGH (ref 0.00–0.50)

## 2020-10-13 LAB — C-REACTIVE PROTEIN: CRP: 0.9 mg/dL (ref <1.0)

## 2020-10-13 LAB — MAGNESIUM: Magnesium: 1.8 mg/dL (ref 1.7–2.4)

## 2020-10-13 MED ORDER — METOPROLOL SUCCINATE ER 25 MG PO TB24: 25.0000 mg | ORAL_TABLET | Freq: Every day | ORAL | Status: DC

## 2020-10-13 MED ORDER — POTASSIUM CHLORIDE 20 MEQ PO PACK
40.0000 meq | PACK | Freq: Once | ORAL | Status: AC
  Administered 2020-10-13: 40 meq via ORAL
  Filled 2020-10-13: qty 2

## 2020-10-13 MED ORDER — AMLODIPINE BESYLATE 10 MG PO TABS: 10.0000 mg | ORAL_TABLET | Freq: Every day | ORAL | Status: DC

## 2020-10-13 MED ORDER — AMLODIPINE BESYLATE 5 MG PO TABS: 5.0000 mg | ORAL_TABLET | Freq: Every day | ORAL | Status: DC

## 2020-10-13 MED ORDER — AMLODIPINE BESYLATE 5 MG PO TABS: 5.0000 mg | ORAL_TABLET | Freq: Every day | ORAL | 0 refills | Status: AC

## 2020-10-13 NOTE — Telephone Encounter (Signed)
Order placed for 14 days Zio XT to be mailed to the patient. Message fwd to scheduling to call the patient to schedule a 6-7 week f/u

## 2020-10-13 NOTE — Progress Notes (Signed)
Remdesivir - Pharmacy Brief Note   O:  CXR: "Right basilar atelectasis versus pneumonia." SpO2: 94-98% on RA   A/P:  Remdesivir 200 mg IVPB once followed by 100 mg IVPB daily x 4 days.    Doloris Hall, PharmD Pharmacy Resident  10/13/2020 7:18 AM

## 2020-10-13 NOTE — Discharge Summary (Signed)
Physician Discharge Summary  Patient ID: Takirah Cabrera MRN: [id number] DOB/AGE: Jan 27, 1934 86 y.o.  Admit date: 10/11/2020 Discharge date: 10/13/2020  Admission Diagnoses:  Discharge Diagnoses:  Principal Problem:   COVID-19 virus detected Active Problems:   Asthma   Hypertension   GERD (gastroesophageal reflux disease)   Generalized weakness   Bradycardia   Discharged Condition: good  Hospital Course:  Carol Cabrera is a 86 y.o. female with medical history significant for hypertension and asthma who presents to the ER for evaluation of generalized weakness. She was found to have positive COVID.  She was started on remdesivir. While on the monitor, patient had a 3 to 4-second pause with a base rhythm of atrial fibrillation.  But patient was asymptomatic.  #1.  COVID-19 pneumonia. Leukopenia secondary to COVID infection. Reviewed chest x-ray, right lower lobe infiltrates.  However, patient does not have any hypoxia, treated with remdesivir.  No need for steroids. Procalcitonin level was also less than 0.1, no need for antibiotics.  Urine culture has multiple Artelia, no evidence of UTI.   2.  paroxysmal atrial fibrillation with bradycardia. Patient has no known history of atrial fibrillation, she had  2 episodes of pause with each episode lasted 3 to 4-second  with basal rhythm of fibrillation. Patiently evaluated by Dr. Mariah Milling from cardiology, she has since converted to sinus, no additional pauses or bradycardia.  Discussed with Dr. Mariah Milling, ZIO monitor will be set up.  Patient is medically stable from cardiology standpoint to discharge.  I have decreased patient Norvasc, patient is not on any beta-blocker. Patient be followed by Dr. Mariah Milling in 2 weeks.    #3.  Essential hypertension.  Consults: cardiology  Significant Diagnostic Studies:   PORTABLE CHEST 1 VIEW   COMPARISON:  Chest x-ray report dated September 30, 2001.   FINDINGS: The heart size and mediastinal contours are  within normal limits. Normal pulmonary vascularity. Mild patchy density in the medial right lung base. No pleural effusion or pneumothorax. No acute osseous abnormality. Unchanged chronic deformity of the left ninth rib.   IMPRESSION: 1. Right basilar atelectasis versus pneumonia.     Electronically Signed   By: Obie Dredge M.D.   On: 10/11/2020 12:21    Treatments: Remdesivir  Discharge Exam: Blood pressure 119/70, pulse 69, temperature 98.2 F (36.8 C), resp. rate 18, height 5\' 9"  (1.753 m), weight 58.5 kg, SpO2 96 %. General appearance: alert and cooperative Resp: clear to auscultation bilaterally Cardio: regular rate and rhythm, S1, S2 normal, no murmur, click, rub or gallop GI: soft, non-tender; bowel sounds normal; no masses,  no organomegaly Extremities: extremities normal, atraumatic, no cyanosis or edema  Disposition: Discharge disposition: 01-Home or Self Care       Discharge Instructions     Diet - low sodium heart healthy   Complete by: As directed    Increase activity slowly   Complete by: As directed       Allergies as of 10/13/2020       Reactions   Aspirin Shortness Of Breath        Medication List     TAKE these medications    acetaminophen 650 MG CR tablet Commonly known as: TYLENOL Take 650 mg by mouth every 8 (eight) hours as needed for pain.   amLODipine 5 MG tablet Commonly known as: NORVASC Take 1 tablet (5 mg total) by mouth daily. What changed:  medication strength how much to take   diphenhydrAMINE 25 mg capsule Commonly known as: BENADRYL Take  50 mg by mouth daily at 12 noon.   esomeprazole 40 MG capsule Commonly known as: NEXIUM Take 40 mg by mouth daily.   losartan 50 MG tablet Commonly known as: COZAAR Take 50 mg by mouth daily.   multivitamin with minerals tablet Take 1 tablet by mouth daily.   Wixela Inhub 250-50 MCG/ACT Aepb Generic drug: fluticasone-salmeterol Inhale 1 puff into the lungs daily.         Follow-up Information     Antonieta Iba, MD Follow up in 2 week(s).   Specialty: Cardiology Contact information: [address] [place] [address] [phone]                 Signed: Marrion Coy 10/13/2020, 1:56 PM

## 2020-10-13 NOTE — Progress Notes (Signed)
*  PRELIMINARY RESULTS* Echocardiogram 2D Echocardiogram has been performed.  Cristela Blue 10/13/2020, 11:38 AM

## 2020-10-13 NOTE — Evaluation (Signed)
Physical Therapy Evaluation Patient Details Name: Carol Cabrera MRN: [id number] DOB: [birth year] Today's Date: 10/13/2020  History of Present Illness  Lily Velasquez is an 86yoF who [facility] c generalized weakness for several days. DTR brings her in due to pt feeling presyncopal during any AMB attempts. PMH: HTN, asthma. In ED on 9/21 tele monitoring showing HR with brief periods of bradycardia in 30s as well as AF c RVR up to 130s. Pt tested (+) COVID19.  Clinical Impression  Pt admitted with above diagnosis. Pt currently with functional limitations due to the deficits listed below (see "PT Problem List"). Upon entry, pt is seated edge bed, awake and agreeable to participate. Pt reports she has felt markedly improved over past 2 days, has been at EOB and/or AMB in room for ADL much of this date, endorses full return to baseline level of function, however her baseline cannot be fully explored this date as she works 9 hour shifts at baseline. The pt is alert, pleasant, interactive, and able to provide info regarding prior level of function, both in tolerance and independence. Pt performs all mobility without any gross unsteadiness or frank LOB, no need for physical assist- tolerates 121ft AMB in room without difficulty, but does have trouble standing for a full 3 minutes for multiple orthostatic BP checks. Extensive education on transient nature of orthostatic dysregulation of BP seen with some COVID cases, emphasized additional safety precautions at DC, continued monitoring of dizziness, drowsiness, BLE weakness, and other presyncope prodrome, and potential need to monitor pressures at DC if symptoms or phenomenon persist. Patient's performance this date reveals overall dramatic improvement in presentation compared to prior to admission, however has orthostatic abnormality without frank symptoms. Pt has impaired independence and tolerance in performing all upright activity. Pt requires additional DME,  close physical assistance, and cues for safe participate in mobility. Pt will benefit from skilled PT intervention to increase independence and safety with basic mobility in preparation for discharge to the venue listed below.     Orthostatic VS for the past 24 hrs (Last 3 readings):  BP- Sitting Pulse- Sitting BP- Standing at 0 minutes Pulse- Standing at 0 minutes BP- Standing at 3 minutes Pulse- Standing at 3 minutes  10/13/20 1609 113/68 75 93/62 86 98/61 88  Pt has BP drop from sitting to standing, improvement at 60 seconds, then worse at 2 minutes, by 3 minutes, reports need to return to sitting as she is unable to tolerate an additional standing value         Recommendations for follow up therapy are one component of a multi-disciplinary discharge planning process, led by the attending physician.  Recommendations may be updated based on patient status, additional functional criteria and insurance authorization.  Follow Up Recommendations No PT follow up;Supervision - Intermittent    Equipment Recommendations  None recommended by PT    Recommendations for Other Services       Precautions / Restrictions Precautions Precautions: Fall Restrictions Weight Bearing Restrictions: No      Mobility  Bed Mobility Overal bed mobility: Independent                  Transfers Overall transfer level: Independent Equipment used: None             General transfer comment: stands for 2-3 mnutes for BP checks, but then expresses need to return to sitting, no specific symptoms when asked  Ambulation/Gait Ambulation/Gait assistance: Modified independent (Device/Increase time) Gait Distance (Feet): 120 Feet  Assistive device: IV Pole       General Gait Details: limited to in-room mobility due to air/con restrictions, but navigates the room and obstacles well with or without IV pole  Stairs            Wheelchair Mobility    Modified Rankin (Stroke Patients Only)        Balance                                             Pertinent Vitals/Pain Pain Assessment: No/denies pain    Home Living Family/patient expects to be discharged to:: Private residence Living Arrangements: Children (works 1st shift daytime, and PT job as well) Available Help at Discharge: Family;Available PRN/intermittently (Son lives 15 minutes) Type of Home: House Home Access: Stairs to enter   [employer] of Steps: 1 step slab Home Layout: One level Home Equipment: None      Prior Function Level of Independence: Needs assistance;Independent   Gait / Transfers Assistance Needed: full yindependent; no falls history  ADL's / Homemaking Assistance Needed: still works as a Restaurant manager, 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  Comments: drives     Hand Dominance   Dominant Hand: Right    Extremity/Trunk Assessment   Upper Extremity Assessment Upper Extremity Assessment: Overall WFL for tasks assessed    Lower Extremity Assessment Lower Extremity Assessment: Overall WFL for tasks assessed       Communication      Cognition Arousal/Alertness: Awake/alert Behavior During Therapy: WFL for tasks assessed/performed Overall Cognitive Status: Within Functional Limits for tasks assessed          General Comments      Exercises     Assessment/Plan    PT Assessment Patient needs continued PT services  PT Problem List Decreased activity tolerance;Decreased mobility       PT Treatment Interventions Functional mobility training;Therapeutic activities;Therapeutic exercise    PT Goals (Current goals can be found in the Care Plan section)  Acute Rehab PT Goals Patient Stated Goal: Return to work and get out of hospital PT Goal Formulation: With patient Time For Goal Achievement: 10/27/20 Potential to Achieve Goals: Good    Frequency Min 2X/week   Barriers to discharge Decreased caregiver support DTR works 2  jobs    Co-evaluation               AM-PAC PT "6 Clicks" Mobility  Outcome Measure Help needed turning from your back to your side while in a flat bed without using bedrails?: None Help needed moving from lying on your back to sitting on the side of a flat bed without using bedrails?: None Help needed moving to and from a bed to a chair (including a wheelchair)?: None Help needed standing up from a chair using your arms (e.g., wheelchair or bedside chair)?: None Help needed to walk in hospital room?: A Little Help needed climbing 3-5 steps with a railing? : A Little 6 Click Score: 22    End of Session   Activity Tolerance: Patient tolerated treatment well;No increased pain Patient left: in bed;with call bell/phone within reach Nurse Communication: Mobility status PT Visit Diagnosis: Other abnormalities of gait and mobility (R26.89);Dizziness and giddiness (R42);Other symptoms and signs involving the nervous system (I26.415)    Time: 8309-4076 PT Time Calculation (min) (ACUTE ONLY): 19 min   Charges:  PT Evaluation $PT Eval Moderate Complexity: 1 Mod PT Treatments $Self Care/Home Management: 8-22       4:21 PM, 10/13/20 Rosamaria Lints, PT, DPT Physical Therapist - [facility]  [phone] (ASCOM)   Miracle Mongillo C 10/13/2020, 4:21 PM

## 2020-10-13 NOTE — Consult Note (Signed)
Cardiology Consultation:   Patient ID: Carol Cabrera MRN: [id number]; DOB: 1934-12-07  Admit date: 10/11/2020 Date of Consult: 10/13/2020  PCP:  Oneita Hurt, No   [facility] Providers Cardiologist:  new to [facility] Reason for consult: Atrial fibrillation Physician requesting consult: Dr. Chipper Herb   Patient Profile:   Carol Cabrera is a 86 y.o. female with no prior cardiac history, Hypertension who presents with malaise, fever, COVID-positive, in the emergency room with atrial fibrillation, sinus pauses History of Present Illness:   Carol Cabrera reports that she works at a [employer] at age 58, no plan on retiring She has good energy, gets up at 4 AM and goes to work at 6 AM Always upbeat, full of energy, no recent illnesses Reports 1 week ago developing general malaise, weakness, sleeping all day up to 14 hours a day Reported having some orthostasis type symptoms at home, unable to walk very far secondary to weakness, anorexia Daughter brought her to the emergency room for further evaluation, Positive for COVID  Admitted to the hospital, started on remdesivir, on broad-spectrum antibiotics On rounds today reports that she feels well  Review of the notes details atrial fibrillation noted with heart rate up to 120 bpm October 13, 2018 to 11:30 AM, also with 4-second pause Nurse walked into the room with slow heartbeat, The Patient to [facility] Her up, She Woke up and Felt Very Nauseous, That time heart rate into the 50s and up into the 90s Blood pressure was stable throughout 1 hour later had additional episode heart rates into the 30s per telemetry, on checking on the patient nurses noted heart rate 120 up to 130 atrial fibrillation  Since that time in the last 24 hours on the floor on telemetry maintaining normal sinus rhythm with no atrial fibrillation  Past Medical History:  Diagnosis Date   Asthma    Hypertension     History reviewed. No pertinent surgical history.   Home  Medications:  Prior to Admission medications   Medication Sig Start Date End Date Taking? Authorizing Provider  acetaminophen (TYLENOL) 650 MG CR tablet Take 650 mg by mouth every 8 (eight) hours as needed for pain.   Yes [provider]  diphenhydrAMINE (BENADRYL) 25 mg capsule Take 50 mg by mouth daily at 12 noon.   Yes [provider]  Multiple Vitamins-Minerals (MULTIVITAMIN WITH MINERALS) tablet Take 1 tablet by mouth daily.   Yes [provider]  amLODipine (NORVASC) 10 MG tablet Take 10 mg by mouth daily. 09/22/20   [provider]  esomeprazole (NEXIUM) 40 MG capsule Take 40 mg by mouth daily. 09/22/20   [provider]  losartan (COZAAR) 50 MG tablet Take 50 mg by mouth daily. 09/29/20   [provider]  Monte Fantasia INHUB 250-50 MCG/ACT AEPB Inhale 1 puff into the lungs daily. 09/29/20   [provider]    Inpatient Medications: Scheduled Meds:  amLODipine  10 mg Oral Daily   vitamin C  500 mg Oral Daily   enoxaparin (LOVENOX) injection  40 mg Subcutaneous Q24H   mometasone-formoterol  2 puff Inhalation BID   multivitamin with minerals  1 tablet Oral Daily   pantoprazole  40 mg Oral Daily   sodium chloride flush  3 mL Intravenous Q12H   zinc sulfate  220 mg Oral Daily   Continuous Infusions:  sodium chloride     remdesivir 100 mg in NS 100 mL Stopped (10/12/20 1148)   PRN Meds: sodium chloride, acetaminophen, albuterol, guaiFENesin-dextromethorphan, ondansetron **OR**  ondansetron (ZOFRAN) IV, sodium chloride flush  Allergies:    Allergies  Allergen Reactions   Aspirin Shortness Of Breath    Social History:   Social History   Socioeconomic History   Marital status: Single    Spouse name: Not on file   Number of children: Not on file   Years of education: Not on file   Highest education level: Not on file  Occupational History   Not on file  Tobacco Use   Smoking status: Never   Smokeless tobacco: Never  Vaping  Use   Vaping Use: Never used  Substance and Sexual Activity   Alcohol use: Not Currently   Drug use: Never   Sexual activity: Not on file  Other Topics Concern   Not on file  Social History Narrative   Not on file   Social Determinants of Health   Financial Resource Strain: Not on file  Food Insecurity: Not on file  Transportation Needs: Not on file  Physical Activity: Not on file  Stress: Not on file  Social Connections: Not on file  Intimate Partner Violence: Not on file    Family History:   History reviewed. No pertinent family history.   ROS:  Please see the history of present illness.  Review of Systems  Constitutional: Negative.   HENT: Negative.    Respiratory: Negative.    Cardiovascular: Negative.   Gastrointestinal: Negative.   Musculoskeletal: Negative.   Neurological: Negative.   Psychiatric/Behavioral: Negative.    All other systems reviewed and are negative.  Physical Exam/Data:   Vitals:   10/13/20 0006 10/13/20 0446 10/13/20 0937 10/13/20 1155  BP: 114/68 122/63 (!) 153/74 119/70  Pulse: 73 66  69  Resp: 18 18  18   Temp: 98.3 F (36.8 C) 98.2 F (36.8 C)  98.2 F (36.8 C)  TempSrc:      SpO2: 95% 95%  96%  Weight:      Height:        Intake/Output Summary (Last 24 hours) at 10/13/2020 1310 Last data filed at 10/13/2020 1155 Gross per 24 hour  Intake 560 ml  Output 700 ml  Net -140 ml   Last 3 Weights 10/10/2020  Weight (lbs) 129 lb  Weight (kg) 58.514 kg     Body mass index is 19.05 kg/m.  General:  Well nourished, well developed, in no acute distress HEENT: normal Neck: no JVD Vascular: No carotid bruits; Distal pulses 2+ bilaterally Cardiac:  normal S1, S2; RRR; no murmur  Lungs:  clear to auscultation bilaterally, no wheezing, rhonchi or rales  Abd: soft, nontender, no hepatomegaly  Ext: no edema Musculoskeletal:  No deformities, BUE and BLE strength normal and equal Skin: warm and dry  Neuro:  CNs 2-12 intact, no focal  abnormalities noted Psych:  Normal affect   EKG:  The EKG was personally reviewed and demonstrates:   EKG dated October 10, 2020 7:49 PM showing normal sinus rhythm with PACs EKG dated October 12, 2020 11:30 AM atrial fibrillation rate 106 bpm rare PVC  Telemetry:  Telemetry was personally reviewed and demonstrates:   Normal sinus rhythm  Relevant CV Studies: Echocardiogram pending  Laboratory Data:  High Sensitivity Troponin:   Recent Labs  Lab 10/10/20 1957 10/10/20 2251  TROPONINIHS 9 11     Chemistry Recent Labs  Lab 10/10/20 1957 10/12/20 0444 10/13/20 0624  NA 136 136 134*  K 3.7 3.7 3.4*  CL 103 103 102  CO2 24 23 22   GLUCOSE 106*  83 104*  BUN 24* 26* 30*  CREATININE 1.05* 0.82 0.85  CALCIUM 9.1 9.1 9.2  MG  --  1.8 1.8  GFRNONAA 52* >60 >60  ANIONGAP 9 10 10     Recent Labs  Lab 10/12/20 0444 10/13/20 0624  PROT 7.4 7.2  ALBUMIN 3.2* 3.3*  AST 24 23  ALT 16 14  ALKPHOS 51 50  BILITOT 0.9 0.8   Lipids No results for input(s): CHOL, TRIG, HDL, LABVLDL, LDLCALC, CHOLHDL in the last 168 hours.  Hematology Recent Labs  Lab 10/10/20 1957 10/12/20 0444 10/13/20 0624  WBC 3.5* 3.0* 3.7*  RBC 4.69 4.48 4.32  HGB 13.3 12.8 12.7  HCT 40.7 38.7 37.8  MCV 86.8 86.4 87.5  MCH 28.4 28.6 29.4  MCHC 32.7 33.1 33.6  RDW 12.3 12.2 12.1  PLT 175 154 172   Thyroid No results for input(s): TSH, FREET4 in the last 168 hours.  BNPNo results for input(s): BNP, PROBNP in the last 168 hours.  DDimer  Recent Labs  Lab 10/12/20 0444 10/13/20 0624  DDIMER 1.78* 1.23*     Radiology/Studies:  DG Chest Port 1 View  Result Date: 10/11/2020 CLINICAL DATA:  Shortness of breath and weakness. EXAM: PORTABLE CHEST 1 VIEW COMPARISON:  Chest x-ray report dated September 30, 2001. FINDINGS: The heart size and mediastinal contours are within normal limits. Normal pulmonary vascularity. Mild patchy density in the medial right lung base. No pleural effusion or  pneumothorax. No acute osseous abnormality. Unchanged chronic deformity of the left ninth rib. IMPRESSION: 1. Right basilar atelectasis versus pneumonia. Electronically Signed   By: October 02, 2001 M.D.   On: 10/11/2020 12:21     Assessment and Plan:   Paroxysmal atrial fibrillation In the setting of COVID infection Episode in the emergency room with noted pauses per nursing notes Rhythm strips did not capture the arrhythmia, only EKG confirming atrial fibrillation -No further atrial fibrillation past 24 hours -Symptomatically feeling better -will order a zio Monitor for further evaluation Hold off on anticoagulation for now, follow-up as outpatient -We will hold off on adding beta-blockers for paroxysmal arrhythmia given concern for pauses in the emergency room.  We will review results of monitor first before changing medication  2.  COVID infection/pneumonia On broad-spectrum antibiotics, remdesivir  3.  Essential hypertension Continue amlodipine 10 daily     Total encounter time more than 110 minutes  Greater than 50% was spent in counseling and coordination of care with the patient   For questions or updates, please contact [facility] Please consult [email or web address] for contact info under    Signed, 10/13/2020, MD  10/13/2020 1:10 PM

## 2020-10-13 NOTE — Telephone Encounter (Signed)
Carol Iba, MD  P Cv Div [facility] Triage Leaving the hospital October 13, 2020  Can we send her a 2-week ZIO monitor for paroxysmal atrial fibrillation  Needs follow-up in clinic  4 weeks after monitor has returned  Thx  TGollan

## 2020-10-16 DIAGNOSIS — I480 Paroxysmal atrial fibrillation: Secondary | ICD-10-CM | POA: Diagnosis not present

## 2020-11-08 NOTE — Progress Notes (Signed)
Cardiology Office Note:    Date:  11/09/2020   ID:  Carol Cabrera, DOB August 07, 1934, MRN [id number]  PCP:  Pcp, No  [facility] Cardiologist:  None  [facility] Electrophysiologist:  None   Referring MD: No ref. provider found   Chief Complaint: Hospital f/u  History of Present Illness:    Carol Cabrera is a 86 y.o. female with a hx of HTN, recently dx of afib who is being seen for hospital follow-up.   She was admitted 09/2020 for COVID infection. During admission she developed afib RVR with rates up to 120. Not started on a BB for concern for pauses. Zio monitor was ordered. She was not started on a/c.  Heart monitor showed predominately NSR with avg heart rate of 76bpm, SVT longest 14 beats. Afib 5% burden ranging 46-157bpm, longest 9 hours, second degree AV block, PVCs  Today, the patient has been feeling normal since the hospitalization, back to her old self. No palpitations, heart racing, chest pain, shortness of breath, LLE, orthopnea, pnd. The patient still works Community education officer. Heart monitor and echo were reviewed. EKG shows SR.   CHADSVASC at least 28 (age x2, female, HTN)  Past Medical History:  Diagnosis Date   Asthma    Hypertension     History reviewed. No pertinent surgical history.  Current Medications: Current Meds  Medication Sig   acetaminophen (TYLENOL) 650 MG CR tablet Take 650 mg by mouth every 8 (eight) hours as needed for pain.   amLODipine (NORVASC) 5 MG tablet Take 1 tablet (5 mg total) by mouth daily.   apixaban (ELIQUIS) 5 MG TABS tablet Take 1 tablet (5 mg total) by mouth 2 (two) times daily.   diphenhydrAMINE (BENADRYL) 25 mg capsule Take 50 mg by mouth daily at 12 noon.   esomeprazole (NEXIUM) 40 MG capsule Take 40 mg by mouth daily.   losartan (COZAAR) 50 MG tablet Take 50 mg by mouth daily.   Multiple Vitamins-Minerals (MULTIVITAMIN WITH MINERALS) tablet Take 1 tablet by mouth daily.   WIXELA INHUB 250-50 MCG/ACT AEPB Inhale 1 puff into the lungs  daily.     Allergies:   Aspirin   Social History   Socioeconomic History   Marital status: Single    Spouse name: Not on file   Number of children: Not on file   Years of education: Not on file   Highest education level: Not on file  Occupational History   Not on file  Tobacco Use   Smoking status: Never   Smokeless tobacco: Never  Vaping Use   Vaping Use: Never used  Substance and Sexual Activity   Alcohol use: Not Currently   Drug use: Never   Sexual activity: Not on file  Other Topics Concern   Not on file  Social History Narrative   Not on file   Social Determinants of Health   Financial Resource Strain: Not on file  Food Insecurity: Not on file  Transportation Needs: Not on file  Physical Activity: Not on file  Stress: Not on file  Social Connections: Not on file     Family History: The patient's family history is not on file.  ROS:   Please see the history of present illness.     All other systems reviewed and are negative.  EKGs/Labs/Other Studies Reviewed:    The following studies were reviewed today   Heart monitor 09/2020 Patch Wear Time:  13 days and 14 hours (2022-09-25T21:23:38-0400 to 2022-10-09T11:35:44-0400)   Patient had a min  HR of 38 bpm, max HR of 182 bpm, and avg HR of 76 bpm. Predominant underlying rhythm was Sinus Rhythm. 12 Supraventricular Tachycardia runs occurred, the run with the fastest interval lasting 6 beats with a max rate of 182 bpm, the  longest lasting 14 beats with an avg rate of 134 bpm. Some episodes of Supraventricular Tachycardia may be possible Atrial Tachycardia with variable block. Atrial Fibrillation occurred (5% burden), ranging from 46-157 bpm (avg of 90 bpm), the longest  lasting 9 hours 55 mins with an avg rate of 92 bpm. Second Degree AV Block-Mobitz I (Wenckebach) was present. Isolated SVEs were occasional (1.9%, [id number]), SVE Couplets were rare (<1.0%, 292), and SVE Triplets were rare (<1.0%, 21). Isolated VEs  were  occasional (1.0%, 15027), VE Couplets were rare (<1.0%, 84), and no VE Triplets were present. Ventricular Bigeminy and Trigeminy were present.  Echo 10/2020  1. Left ventricular ejection fraction, by estimation, is 55 to 60%. The  left ventricle has normal function. The left ventricle has no regional  wall motion abnormalities. There is mild left ventricular hypertrophy.  Left ventricular diastolic parameters  are consistent with Grade I diastolic dysfunction (impaired relaxation).   2. Right ventricular systolic function is normal. The right ventricular  size is normal. There is normal pulmonary artery systolic pressure. The  estimated right ventricular systolic pressure is 31.6 mmHg.   3. The mitral valve is normal in structure. Mild mitral valve  regurgitation.   EKG:  EKG is ordered today.  The ekg ordered today demonstrates NSR, 74bpm, LVH  Recent Labs: 10/13/2020: ALT 14; BUN 30; Creatinine, Ser 0.85; Hemoglobin 12.7; Magnesium 1.8; Platelets 172; Potassium 3.4; Sodium 134  Recent Lipid Panel No results found for: CHOL, TRIG, HDL, CHOLHDL, VLDL, LDLCALC, LDLDIRECT    Physical Exam:    VS:  BP (!) 188/110 (BP Location: Left Arm, Patient Position: Sitting, Cuff Size: Normal)   Pulse 74   Ht 5\' 9"  (1.753 m)   Wt 145 lb (65.8 kg)   SpO2 96%   BMI 21.41 kg/m     Wt Readings from Last 3 Encounters:  11/09/20 145 lb (65.8 kg)  10/10/20 129 lb (58.5 kg)     GEN:  Well nourished, well developed in no acute distress HEENT: Normal NECK: No JVD; No carotid bruits LYMPHATICS: No lymphadenopathy CARDIAC: RRR, no murmurs, rubs, gallops RESPIRATORY:  Clear to auscultation without rales, wheezing or rhonchi  ABDOMEN: Soft, non-tender, non-distended MUSCULOSKELETAL:  No edema; No deformity  SKIN: Warm and dry NEUROLOGIC:  Alert and oriented x 3 PSYCHIATRIC:  Normal affect   ASSESSMENT:    1. PAF (paroxysmal atrial fibrillation) (HCC)   2. Essential hypertension   3.  Second degree AV block, Mobitz type I    PLAN:    In order of problems listed above:  Recently diagnosed Afib, paroxysmal Afib in the setting of COVID. Echo showed LVEF 55-60%. Heart monitor showed NSR with avg heart rate of 76bpm, SVT longest 14 beats. Afib 5% burden ranging 46-157bpm, longest 9 hours, second degree AV bock, PVCs Has wine on the weekend. No sign caffeine use. Will check TSH. CHADSVASC at least 4 (agex2, HTN, female). I will start Eliquis 5mg  BID. Cannot start rate control medication with second degree AV block Type 1 on heart monitor. Will also refer to EP for any further recommendations, esp given second degree AV block type 1 seen on heart monitor.   HTN BP Initial 188/110, re-check 172/88. She reports possible white coat  syndrome however does not check BP at home. Request she take bps for 1-2 weeks and report back. Continue amlodipine 5mg  daily and losartan 50mg  daily.   Second degree AV block Type 1 Seen on heart monitor, refer to EP as above for establishment   Disposition: Follow up in 2-3 week(s) with MD/APP     Signed, Denzel Etienne , PA-C  11/09/2020 4:32 PM    [facility]

## 2020-11-09 ENCOUNTER — Encounter: Payer: Self-pay | Admitting: Medical

## 2020-11-09 ENCOUNTER — Ambulatory Visit: Payer: Medicare Other | Admitting: Medical

## 2020-11-09 ENCOUNTER — Other Ambulatory Visit: Payer: Self-pay

## 2020-11-09 VITALS — BP 188/110 | HR 74 | Ht 69.0 in | Wt 145.0 lb

## 2020-11-09 DIAGNOSIS — I480 Paroxysmal atrial fibrillation: Secondary | ICD-10-CM | POA: Diagnosis not present

## 2020-11-09 DIAGNOSIS — I441 Atrioventricular block, second degree: Secondary | ICD-10-CM | POA: Diagnosis not present

## 2020-11-09 DIAGNOSIS — I10 Essential (primary) hypertension: Secondary | ICD-10-CM | POA: Diagnosis not present

## 2020-11-09 MED ORDER — APIXABAN 5 MG PO TABS: 5.0000 mg | ORAL_TABLET | Freq: Two times a day (BID) | ORAL | 11 refills | Status: AC

## 2020-11-09 NOTE — Patient Instructions (Addendum)
Medication Instructions:  Your physician has recommended you make the following change in your medication:   START Eliquis 5 mg twice a day  Please monitor blood pressures and keep a log to bring into your next visit. Here are some tips when monitoring.  Please monitor blood pressures and keep a log of your readings.   Make sure to check 2 hours after your medications.   AVOID these things for 30 minutes before checking your blood pressure: No Drinking caffeine. No Drinking alcohol. No Eating. No Smoking. No Exercising.  Five minutes before checking your blood pressure: Pee. Sit in a dining chair. Avoid sitting in a soft couch or armchair. Be quiet. Do not talk.   *If you need a refill on your cardiac medications before your next appointment, please call your pharmacy*   Lab Work: TSH today  If you have labs (blood work) drawn today and your tests are completely normal, you will receive your results only by: MyChart Message (if you have MyChart) OR A paper copy in the mail If you have any lab test that is abnormal or we need to change your treatment, we will call you to review the results.   Testing/Procedures: None   Follow-Up: At [facility], you and your health needs are our priority.  As part of our continuing mission to provide you with exceptional heart care, we have created designated Provider Care Teams.  These Care Teams include your primary Cardiologist (physician) and Advanced Practice Providers (APPs -  Physician Assistants and Nurse Practitioners) who all work together to provide you with the care you need, when you need it.  We recommend signing up for the patient portal called "MyChart".  Sign up information is provided on this After Visit Summary.  MyChart is used to connect with patients for Virtual Visits (Telemedicine).  Patients are able to view lab/test results, encounter notes, upcoming appointments, etc.  Non-urgent messages can be sent to your  provider as well.   To learn more about what you can do with MyChart, go to [email or web address].    Your next appointment:   3 week(s)  The format for your next appointment:   In Person  Provider:   You may see Dr. Julien Nordmann or one of the following Advanced Practice Providers on your designated Care Team:   Nicolasa Ducking, NP Eula Listen, PA-C Marisue Ivan, PA-C Cadence Fransico Michael, [place]   Other Instructions Referral placed to EP provider.

## 2020-11-09 NOTE — Progress Notes (Signed)
Patient assistance application completed and list of information needed to fax that into company.  Medication Samples have been provided to the patient.  Drug name: Eliquis        Strength: 5 mg          Qty: 2 boxes   LOT: [id number]   Exp.Date: 09/24

## 2020-11-10 ENCOUNTER — Telehealth: Payer: Self-pay | Admitting: Medical

## 2020-11-10 LAB — TSH: TSH: 1.22 u[IU]/mL (ref 0.450–4.500)

## 2020-11-10 NOTE — Telephone Encounter (Signed)
Lm to call office needs 3 week fu

## 2020-12-02 ENCOUNTER — Other Ambulatory Visit: Payer: Self-pay

## 2020-12-02 ENCOUNTER — Encounter: Payer: Self-pay | Admitting: Medical

## 2020-12-02 ENCOUNTER — Ambulatory Visit: Payer: Medicare Other | Admitting: Medical

## 2020-12-02 VITALS — BP 158/80 | HR 100 | Ht 69.0 in | Wt 151.0 lb

## 2020-12-02 DIAGNOSIS — Z79899 Other long term (current) drug therapy: Secondary | ICD-10-CM

## 2020-12-02 DIAGNOSIS — I10 Essential (primary) hypertension: Secondary | ICD-10-CM

## 2020-12-02 DIAGNOSIS — I441 Atrioventricular block, second degree: Secondary | ICD-10-CM

## 2020-12-02 DIAGNOSIS — I480 Paroxysmal atrial fibrillation: Secondary | ICD-10-CM

## 2020-12-02 DIAGNOSIS — R001 Bradycardia, unspecified: Secondary | ICD-10-CM | POA: Diagnosis not present

## 2020-12-02 NOTE — Progress Notes (Signed)
Cardiology Office Note:    Date:  12/02/2020   ID:  Carol Cabrera, DOB 05-13-1934, MRN [id number]  PCP:  Pcp, No  [facility] Cardiologist:  None  [facility] Electrophysiologist:  None   Referring MD: No ref. provider found   Chief Complaint: 3 week follow-up  History of Present Illness:    Carol Cabrera is a 86 y.o. female with a hx of a hx of HTN, recently dx of afib who is being seen for hospital follow-up.    She was admitted 09/2020 for COVID infection. During admission she developed afib RVR with rates up to 120. Not started on a BB for concern for pauses. Zio monitor was ordered. She was not started on a/c.   Heart monitor showed predominately NSR with avg heart rate of 76bpm, SVT longest 14 beats. Afib 5% burden ranging 46-157bpm, longest 9 hours, second degree AV block, PVCs   Last seen 11/09/20, the patient has been feeling normal since the hospitalization, back to her old self. No palpitations, heart racing, chest pain, shortness of breath, LLE, orthopnea, pnd. The patient still works Community education officer. Heart monitor and echo were reviewed. EKG shows SR. CHADSVASC at least 72 (age x2, female, HTN). She was started on Eliquis 5mg  BID. Referred to EP for second degree AV block Type 1.  Today, the patient reports she has been doing well since the last visit. She had a home visit from [facility] and it went well. BP log shows 120-140s/70-80s. BP a little high today, had her medications today. No bleeding issues on Eliquis. Discussed increasing amlodipine, but patient would like to not make changes at this time. She is under some stress with taking son to HD. She is getting some help with him in the near future.   Past Medical History:  Diagnosis Date   Asthma    Hypertension     History reviewed. No pertinent surgical history.  Current Medications: Current Meds  Medication Sig   acetaminophen (TYLENOL) 650 MG CR tablet Take 650 mg by mouth every 8 (eight) hours as needed for pain.    amLODipine (NORVASC) 5 MG tablet Take 1 tablet (5 mg total) by mouth daily.   apixaban (ELIQUIS) 5 MG TABS tablet Take 1 tablet (5 mg total) by mouth 2 (two) times daily.   diphenhydrAMINE (BENADRYL) 25 mg capsule Take 50 mg by mouth daily at 12 noon.   esomeprazole (NEXIUM) 40 MG capsule Take 40 mg by mouth daily.   losartan (COZAAR) 50 MG tablet Take 50 mg by mouth daily.   Multiple Vitamins-Minerals (MULTIVITAMIN WITH MINERALS) tablet Take 1 tablet by mouth daily.   WIXELA INHUB 250-50 MCG/ACT AEPB Inhale 1 puff into the lungs daily.     Allergies:   Aspirin   Social History   Socioeconomic History   Marital status: Single    Spouse name: Not on file   Number of children: Not on file   Years of education: Not on file   Highest education level: Not on file  Occupational History   Not on file  Tobacco Use   Smoking status: Never   Smokeless tobacco: Never  Vaping Use   Vaping Use: Never used  Substance and Sexual Activity   Alcohol use: Not Currently   Drug use: Never   Sexual activity: Not on file  Other Topics Concern   Not on file  Social History Narrative   Not on file   Social Determinants of Health   Financial Resource Strain: Not  on file  Food Insecurity: Not on file  Transportation Needs: Not on file  Physical Activity: Not on file  Stress: Not on file  Social Connections: Not on file     Family History: The patient's family history is not on file.  ROS:   Please see the history of present illness.     All other systems reviewed and are negative.  EKGs/Labs/Other Studies Reviewed:    The following studies were reviewed today:   Heart monitor 09/2020 Patch Wear Time:  13 days and 14 hours (2022-09-25T21:23:38-0400 to 2022-10-09T11:35:44-0400)   Patient had a min HR of 38 bpm, max HR of 182 bpm, and avg HR of 76 bpm. Predominant underlying rhythm was Sinus Rhythm. 12 Supraventricular Tachycardia runs occurred, the run with the fastest interval lasting  6 beats with a max rate of 182 bpm, the  longest lasting 14 beats with an avg rate of 134 bpm. Some episodes of Supraventricular Tachycardia may be possible Atrial Tachycardia with variable block. Atrial Fibrillation occurred (5% burden), ranging from 46-157 bpm (avg of 90 bpm), the longest  lasting 9 hours 55 mins with an avg rate of 92 bpm. Second Degree AV Block-Mobitz I (Wenckebach) was present. Isolated SVEs were occasional (1.9%, [id number]), SVE Couplets were rare (<1.0%, 292), and SVE Triplets were rare (<1.0%, 21). Isolated VEs were  occasional (1.0%, 15027), VE Couplets were rare (<1.0%, 84), and no VE Triplets were present. Ventricular Bigeminy and Trigeminy were present.   Echo 10/2020  1. Left ventricular ejection fraction, by estimation, is 55 to 60%. The  left ventricle has normal function. The left ventricle has no regional  wall motion abnormalities. There is mild left ventricular hypertrophy.  Left ventricular diastolic parameters  are consistent with Grade I diastolic dysfunction (impaired relaxation).   2. Right ventricular systolic function is normal. The right ventricular  size is normal. There is normal pulmonary artery systolic pressure. The  estimated right ventricular systolic pressure is 31.6 mmHg.   3. The mitral valve is normal in structure. Mild mitral valve  regurgitation.  EKG:  EKG is not ordered today.    Recent Labs: 10/13/2020: ALT 14; BUN 30; Creatinine, Ser 0.85; Hemoglobin 12.7; Magnesium 1.8; Platelets 172; Potassium 3.4; Sodium 134 11/09/2020: TSH 1.220  Recent Lipid Panel No results found for: CHOL, TRIG, HDL, CHOLHDL, VLDL, LDLCALC, LDLDIRECT    Physical Exam:    VS:  BP (!) 158/80   Pulse 100   Ht 5\' 9"  (1.753 m)   Wt 151 lb (68.5 kg)   SpO2 97%   BMI 22.30 kg/m     Wt Readings from Last 3 Encounters:  12/02/20 151 lb (68.5 kg)  11/09/20 145 lb (65.8 kg)  10/10/20 129 lb (58.5 kg)     GEN:  Well nourished, well developed in no acute  distress HEENT: Normal NECK: No JVD; No carotid bruits LYMPHATICS: No lymphadenopathy CARDIAC: Irreg IRreg, no murmurs, rubs, gallops RESPIRATORY:  Clear to auscultation without rales, wheezing or rhonchi  ABDOMEN: Soft, non-tender, non-distended MUSCULOSKELETAL:  No edema; No deformity  SKIN: Warm and dry NEUROLOGIC:  Alert and oriented x 3 PSYCHIATRIC:  Normal affect   ASSESSMENT:    1. Paroxysmal atrial fibrillation (HCC)   2. Medication management   3. Bradycardia   4. Essential hypertension   5. Second degree AV block, Mobitz type I    PLAN:    In order of problems listed above:  Paroxysmal Afib In Afib by exam with HR aorund 100bpm.  She is asymptomatic. She denies bleeding issues with Eliquis. Will check CBC today. Prior heart monitor showed PAF with 5% burden and second degree AV block tYpe 1. Would not start BB given possible exacerbation of second degree AV block. Dig is not a good option given it is paroxysmal. Referred to EP at the last visit, however apt was not made. Will re-refer to EP, Dr. Lalla Brothers, for AAA vs ablation.   HTN BP elevated today. Log shows BP [id number]/70-80s. Patient feels elevation is due to stress at home with her son. Says this should improve in the near future. She would prefer to continue current medications at current doses. Continue amlodipine 5mg  and Losartan 50mg  daily.    Second degree AV block Noted on recent heart monitor. Refer to EP as above.   Disposition: Follow up in 3 month(s) with MD/APP     Signed, Kenesha Moshier , PA-C  12/02/2020 1:56 PM    [facility]

## 2020-12-02 NOTE — Patient Instructions (Signed)
Medication Instructions:  Please continue current medication  *If you need a refill on your cardiac medications before your next appointment, please call your pharmacy*   Lab Work: CBC  Testing/Procedures: None   Follow-Up: At [facility], you and your health needs are our priority.  As part of our continuing mission to provide you with exceptional heart care, we have created designated Provider Care Teams.  These Care Teams include your primary Cardiologist (physician) and Advanced Practice Providers (APPs -  Physician Assistants and Nurse Practitioners) who all work together to provide you with the care you need, when you need it.  We recommend signing up for the patient portal called "MyChart".  Sign up information is provided on this After Visit Summary.  MyChart is used to connect with patients for Virtual Visits (Telemedicine).  Patients are able to view lab/test results, encounter notes, upcoming appointments, etc.  Non-urgent messages can be sent to your provider as well.   To learn more about what you can do with MyChart, go to [email or web address].    Your next appointment:   3 month(s)  The format for your next appointment:   In Person  Provider:   You may see  or one of the following Advanced Practice Providers on your designated Care Team:    Carol Cabrera, [place]   Other Instructions None

## 2020-12-03 LAB — CBC
Hematocrit: 38.3 % (ref 34.0–46.6)
Hemoglobin: 11.9 g/dL (ref 11.1–15.9)
MCH: 27.4 pg (ref 26.6–33.0)
MCHC: 31.1 g/dL — ABNORMAL LOW (ref 31.5–35.7)
MCV: 88 fL (ref 79–97)
Platelets: 187 10*3/uL (ref 150–450)
RBC: 4.35 x10E6/uL (ref 3.77–5.28)
RDW: 13.1 % (ref 11.7–15.4)
WBC: 5.7 10*3/uL (ref 3.4–10.8)

## 2021-03-10 ENCOUNTER — Ambulatory Visit: Payer: Medicare Other | Admitting: Medical

## 2021-06-22 NOTE — Telephone Encounter (Signed)
Attempted to schedule.  

## 2021-06-22 NOTE — Telephone Encounter (Signed)
Number out of service.

## 2021-08-17 NOTE — Telephone Encounter (Signed)
Unable to reach to schedule with cadence .  Closing encounter.

## 2022-10-14 IMAGING — DX DG CHEST 1V PORT
1 series · 1 of 1 positions shown · non-contrast
Comparison: Chest x-ray report dated September 30, 2001.

CLINICAL DATA: Shortness of breath and weakness.

EXAM:
PORTABLE CHEST 1 VIEW

[chest ap]
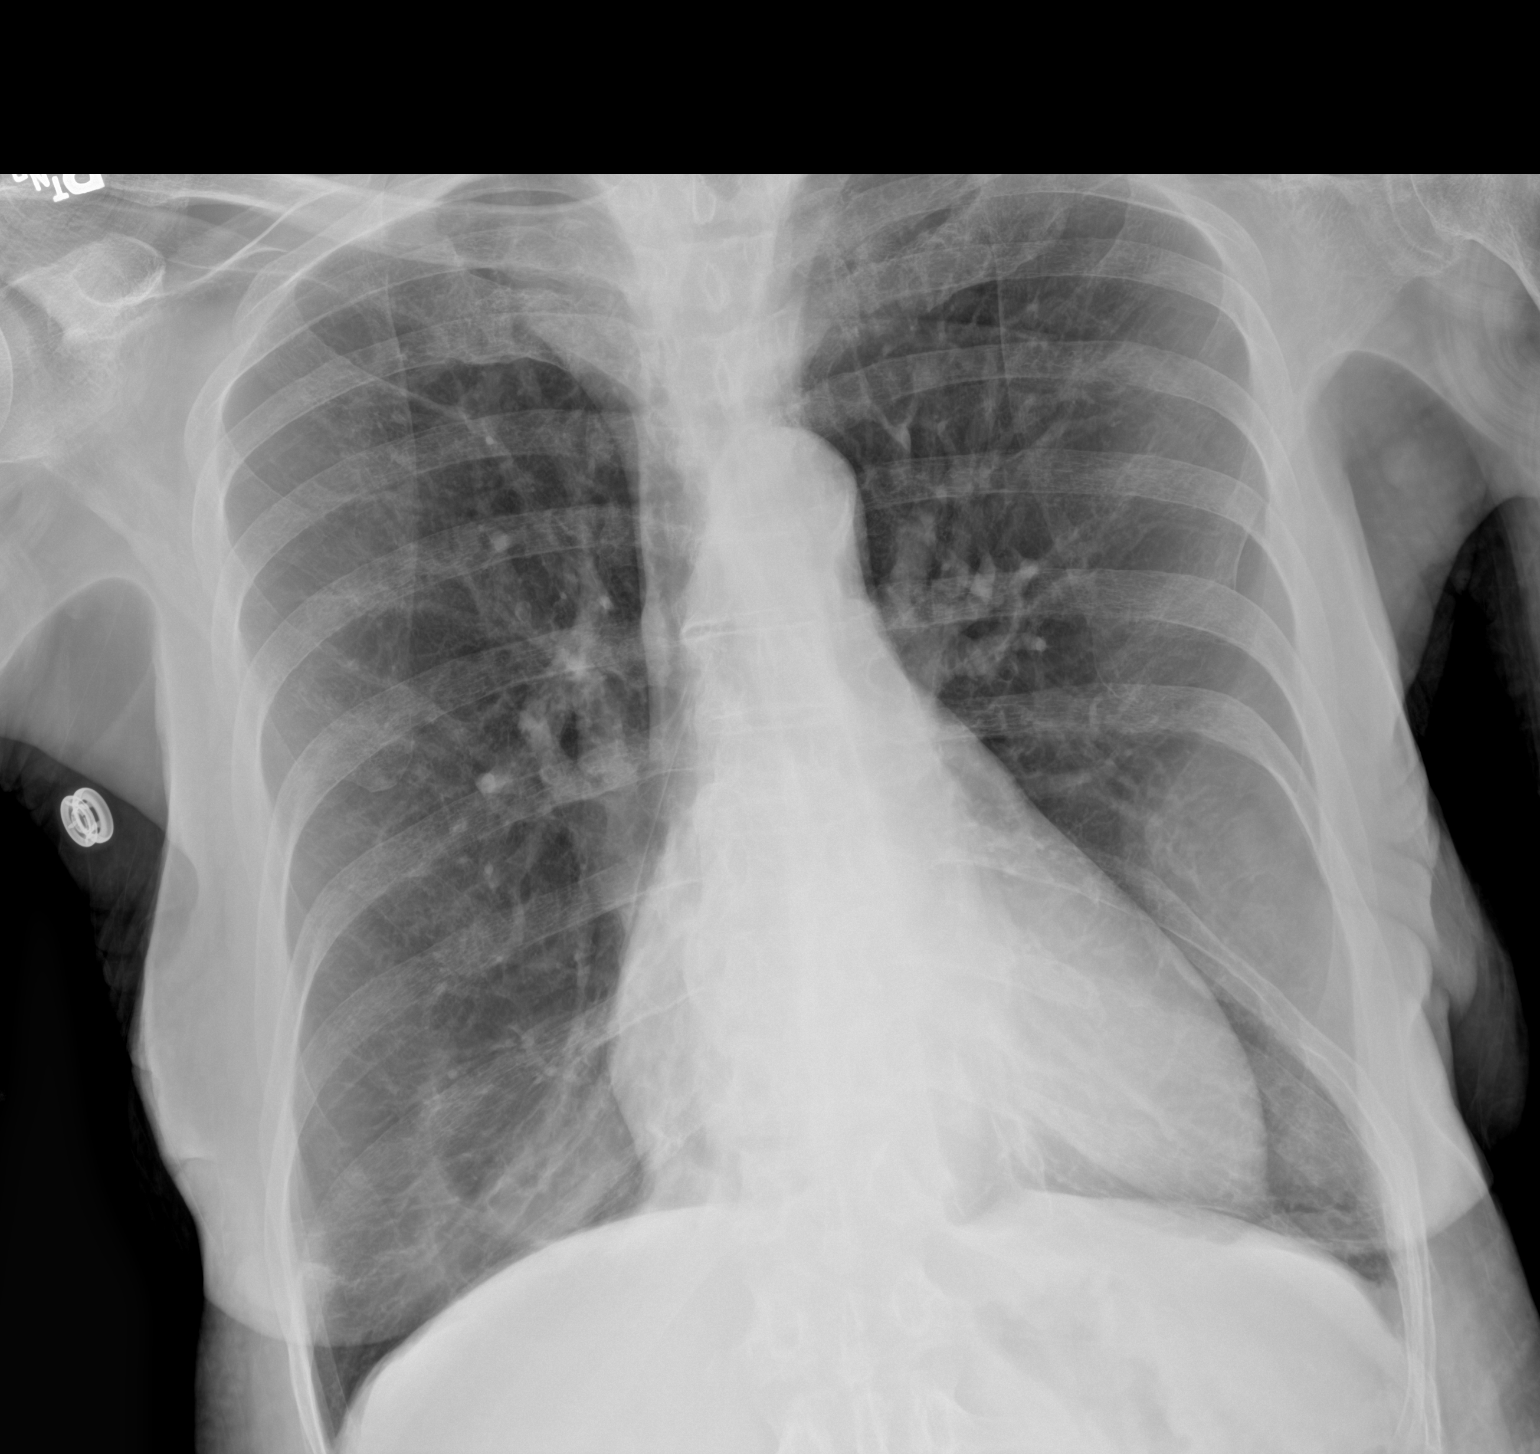

[1 of 1 positions shown; findings below may reference images not displayed]

FINDINGS: The heart size and mediastinal contours are within normal limits.
Normal pulmonary vascularity. Mild patchy density in the medial
right lung base. No pleural effusion or pneumothorax. No acute
osseous abnormality. Unchanged chronic deformity of the left ninth
rib.
IMPRESSION: 1. Right basilar atelectasis versus pneumonia.
